# Patient Record
Sex: Female | Born: 1970 | Race: White | Hispanic: No | Marital: Married | Smoking: Former smoker
Health system: Southern US, Community
[De-identification: ages and names within clinical notes are randomized; demographics above are authoritative.]

## PROBLEM LIST (undated history)

## (undated) HISTORY — PX: KNEE SURGERY: SHX244

---

## 2016-09-03 ENCOUNTER — Emergency Department
Admission: EM | Admit: 2016-09-03 | Discharge: 2016-09-03 | Disposition: A | Discharge status: Home / Self Care | Payer: Managed Care, Other (non HMO) | Source: Home / Self Care | Attending: Family Medicine | Admitting: Family Medicine

## 2016-09-03 ENCOUNTER — Emergency Department (INDEPENDENT_AMBULATORY_CARE_PROVIDER_SITE_OTHER): Payer: Managed Care, Other (non HMO)

## 2016-09-03 ENCOUNTER — Encounter: Payer: Self-pay | Admitting: Emergency Medicine

## 2016-09-03 DIAGNOSIS — M533 Sacrococcygeal disorders, not elsewhere classified: Secondary | ICD-10-CM

## 2016-09-03 DIAGNOSIS — M545 Low back pain: Secondary | ICD-10-CM

## 2016-09-03 DIAGNOSIS — Z3200 Encounter for pregnancy test, result unknown: Secondary | ICD-10-CM | POA: Diagnosis not present

## 2016-09-03 DIAGNOSIS — S3992XA Unspecified injury of lower back, initial encounter: Secondary | ICD-10-CM

## 2016-09-03 DIAGNOSIS — W010XXA Fall on same level from slipping, tripping and stumbling without subsequent striking against object, initial encounter: Secondary | ICD-10-CM

## 2016-09-03 DIAGNOSIS — M79672 Pain in left foot: Secondary | ICD-10-CM | POA: Diagnosis not present

## 2016-09-03 DIAGNOSIS — M4317 Spondylolisthesis, lumbosacral region: Secondary | ICD-10-CM

## 2016-09-03 DIAGNOSIS — N760 Acute vaginitis: Secondary | ICD-10-CM

## 2016-09-03 LAB — POCT URINE PREGNANCY: Preg Test, Ur: NEGATIVE

## 2016-09-03 MED ORDER — MELOXICAM 15 MG PO TABS: 15.0000 mg | ORAL_TABLET | Freq: Every day | ORAL | 0 refills | Status: AC

## 2016-09-03 MED ORDER — TRAMADOL HCL 50 MG PO TABS: 50.0000 mg | ORAL_TABLET | Freq: Four times a day (QID) | ORAL | 0 refills | Status: AC | PRN

## 2016-09-03 NOTE — ED Triage Notes (Signed)
Pt c/o tailbone pain after slipping in the yard yesterday. Also c/o left foot pain and vaginal itching x1 week. Tried monistat with no relief.

## 2016-09-03 NOTE — ED Provider Notes (Signed)
biCSN: [id number]     Arrival date & time 09/03/16  1019 History   First MD Initiated Contact with Patient 09/03/16 1053     Chief Complaint  Patient presents with  . Tailbone Pain   (Consider location/radiation/quality/duration/timing/severity/associated sxs/prior Treatment) HPI  Loretta Williams is a 46 y.o. female presenting to UC with a couple of concerns today.  PT c/o sudden onset tailbone and lower back pain after slipping and falling onto wet grass last night after mowing the lawn.  Pain is aching and sore, worse with certain movements and while sitting.  Pain is 9/10.  She did take some ibuprofen last night.  Pt also c/o Left foot pain that has been intermittent for about 3-4 weeks. Hx of plantar fascitis in Right foot. Pain feels similar.  Pain is worse in the evening, worse with dorsiflexing her foot.  Pain is worse on the bottom of her heel. No known injury.   Pt c/o vaginal itching and irritation. Hx of recurrent yeast infections. She has urinary incontinence with sneezing or coughing so she wears a pad and believes that is what causes the yeast infections.  Symptoms have been present for about 1 week. No relief with OTC monostat. Denies concern for STIs or bladder infection. She wants to provide a self-swab in the clinic as she notes this is typically what she does when she is seen for vaginal irritation. Denies recent use of antibiotics.   History reviewed. No pertinent past medical history. Past Surgical History:  Procedure Laterality Date  . KNEE SURGERY     History reviewed. No pertinent family history. Social History  Substance Use Topics  . Smoking status: Former Games developermoker  . Smokeless tobacco: Never Used  . Alcohol use Yes   OB History    No data available     Review of Systems  Gastrointestinal: Negative for abdominal pain, diarrhea, nausea and vomiting.  Genitourinary: Positive for vaginal discharge and vaginal pain (itching and irritation). Negative for dysuria,  flank pain, frequency, hematuria, pelvic pain, urgency and vaginal bleeding.  Musculoskeletal: Positive for arthralgias, back pain, gait problem and myalgias. Negative for joint swelling and neck pain.  Skin: Negative for color change, rash and wound.  Neurological: Negative for weakness and numbness.    Allergies  Patient has no allergy information on record.  Home Medications   Prior to Admission medications   Medication Sig Start Date End Date Taking? Authorizing Provider  meloxicam (MOBIC) 15 MG tablet Take 1 tablet (15 mg total) by mouth daily. 09/03/16   Junius Finner'Malley, Soriya Worster, PA-C  traMADol (ULTRAM) 50 MG tablet Take 1 tablet (50 mg total) by mouth every 6 (six) hours as needed. 09/03/16   Junius Finner'Malley, Kattleya Kuhnert, PA-C   Meds Ordered and Administered this Visit  Medications - No data to display  BP 122/84 (BP Location: Right Arm)   Pulse 78   Temp 98.6 F (37 C) (Oral)   Wt 130 lb (59 kg)   LMP 07/21/2016 (Approximate) Comment: OCP  SpO2 98%  No data found.   Physical Exam  Constitutional: She is oriented to person, place, and time. She appears well-developed and well-nourished. No distress.  HENT:  Head: Normocephalic and atraumatic.  Mouth/Throat: Oropharynx is clear and moist.  Eyes: EOM are normal.  Neck: Normal range of motion.  Cardiovascular: Normal rate.   Pulses:      Dorsalis pedis pulses are 2+ on the left side.       Posterior tibial pulses are 2+ on the  left side.  Pulmonary/Chest: Effort normal.  Abdominal: Soft. She exhibits no distension. There is no tenderness.  Musculoskeletal: Normal range of motion. She exhibits tenderness. She exhibits no edema.  Tenderness to lower lumbar spine and buttock at SI joint and over sacrum.  Mild tenderness to Right side lumbar muscles and buttock. Antalgic gait.  Full ROM upper and lower extremities. Left foot: full ROM, tenderness to bottom of heel.   Neurological: She is alert and oriented to person, place, and time.  Skin: Skin  is warm and dry. Capillary refill takes less than 2 seconds. No rash noted. She is not diaphoretic. No erythema.  Psychiatric: She has a normal mood and affect. Her behavior is normal.  Nursing note and vitals reviewed.   Urgent Care Course     Procedures (including critical care time)  Labs Review Labs Reviewed  WET PREP BY MOLECULAR PROBE  POCT URINE PREGNANCY    Imaging Review Dg Lumbar Spine Complete  Result Date: 09/03/2016 CLINICAL DATA:  Fall yesterday. Central low back and tailbone pain. Initial encounter. EXAM: LUMBAR SPINE - COMPLETE 4+ VIEW COMPARISON:  None. FINDINGS: Five lumbar type vertebral bodies. There is a minimal anterolisthesis at L5-S1. The alignment is otherwise normal. The disc spaces are preserved. No evidence of acute fracture or pars defect. Mild facet degenerative changes are present. There is a small metallic foreign body in the presacral area. IMPRESSION: No acute osseous findings. Mild lower lumbar spondylosis with minimal degenerative anterolisthesis at L5-S1. Electronically Signed   By: Carey Bullocks M.D.   On: 09/03/2016 11:30   Dg Sacrum/coccyx  Result Date: 09/03/2016 CLINICAL DATA:  Fall yesterday. Central low back and tailbone pain. Initial encounter. EXAM: SACRUM AND COCCYX - 2+ VIEW COMPARISON:  None. FINDINGS: There is no evidence of acute fracture. The sacroiliac joints are intact. Small metallic foreign body is noted in the presacral soft tissues. IMPRESSION: No acute osseous findings. Electronically Signed   By: Carey Bullocks M.D.   On: 09/03/2016 11:31   Dg Foot Complete Left  Result Date: 09/03/2016 CLINICAL DATA:  Lateral foot pain for 1 month.  No known injury. EXAM: LEFT FOOT - COMPLETE 3+ VIEW COMPARISON:  None. FINDINGS: The mineralization and alignment are normal. There is no evidence of acute fracture or dislocation. The joint spaces are maintained. There is a small plantar calcaneal spur and type 1 accessory navicular. There is a small  density within the nail of the great toe. IMPRESSION: No acute osseous findings. Electronically Signed   By: Carey Bullocks M.D.   On: 09/03/2016 11:33    MDM   1. Fall from slip, trip, or stumble, initial encounter   2. Tailbone injury, initial encounter   3. Left foot pain   4. Acute vaginitis    Plain films: no acute findings. Reassured pain. Encouraged use of ice and cushioned seating while pain gradually improves.  Rx: Tramadol and meloxicam Will hold off on medication for vaginitis until Wet Prep results.   Home care instructions provided. f/u with PCP and/or Sports Medicine as needed.     Junius Finner, PA-C 09/03/16 1207

## 2016-09-03 NOTE — Discharge Instructions (Signed)
Tramadol is strong pain medication. While taking, do not drink alcohol, drive, or perform any other activities that requires focus while taking these medications.  ° °Meloxicam (Mobic) is an antiinflammatory to help with pain and inflammation.  Do not take ibuprofen, Advil, Aleve, or any other medications that contain NSAIDs while taking meloxicam as this may cause stomach upset or even ulcers if taken in large amounts for an extended period of time.  ° ° °

## 2016-09-05 LAB — WET PREP BY MOLECULAR PROBE
Candida species: NOT DETECTED
Gardnerella vaginalis: DETECTED — AB
Trichomonas vaginosis: NOT DETECTED

## 2016-09-06 ENCOUNTER — Telehealth: Payer: Self-pay | Admitting: *Deleted

## 2016-09-06 MED ORDER — METRONIDAZOLE 500 MG PO TABS: 500.0000 mg | ORAL_TABLET | Freq: Two times a day (BID) | ORAL | 0 refills | Status: AC

## 2016-09-06 NOTE — Telephone Encounter (Signed)
Callback: Patient advised of wet prep results. Per Dr. Cathren HarshBeese, escribed Flagyl 500 mg BID x 7 day to [facility]. Advised to avoid alcohol while taking.

## 2018-12-25 IMAGING — DX DG LUMBAR SPINE COMPLETE 4+V
5 series · 5 of 5 positions shown · non-contrast
Comparison: None.

CLINICAL DATA: Fall yesterday. Central low back and tailbone pain.
Initial encounter.

EXAM:
LUMBAR SPINE - COMPLETE 4+ VIEW

[l-spine ap]
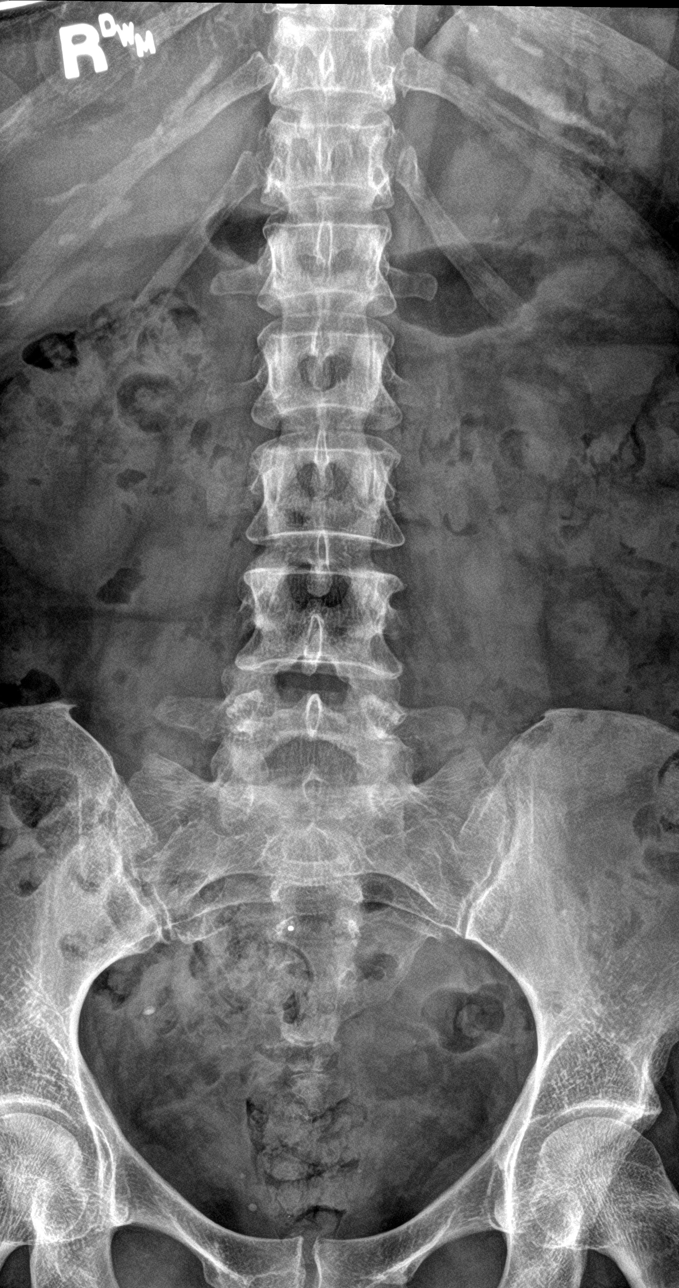

[l-spine obl (1 of 2)]
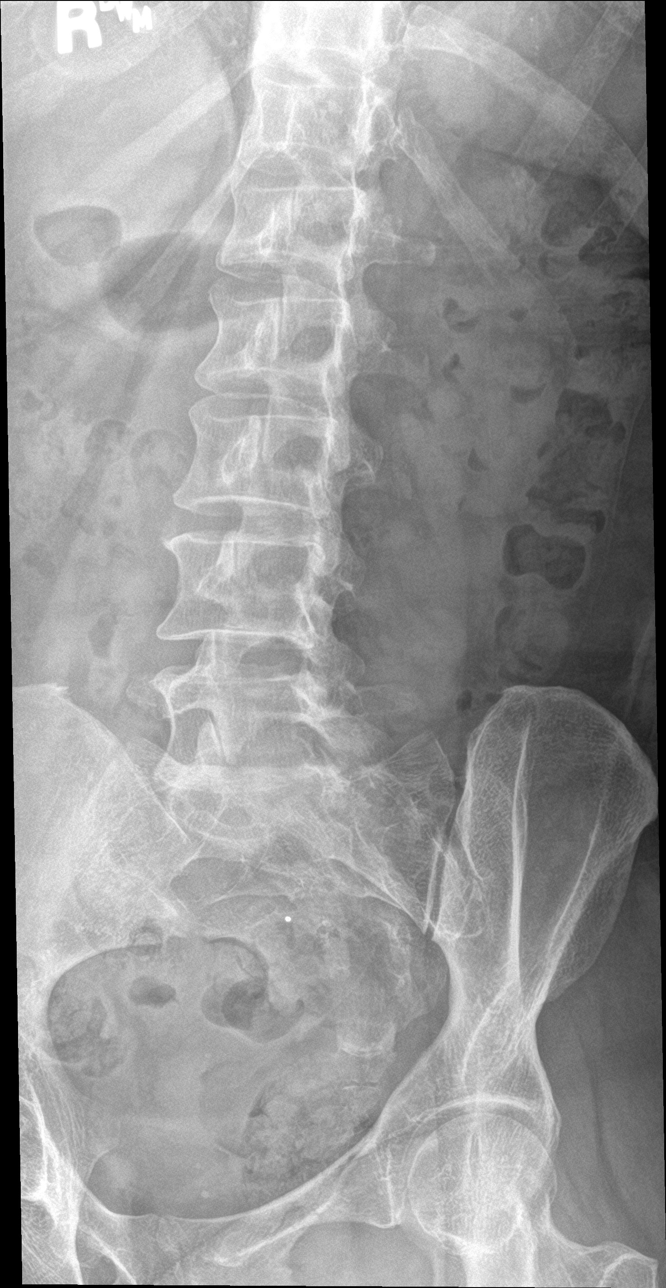

[l-spine lat]
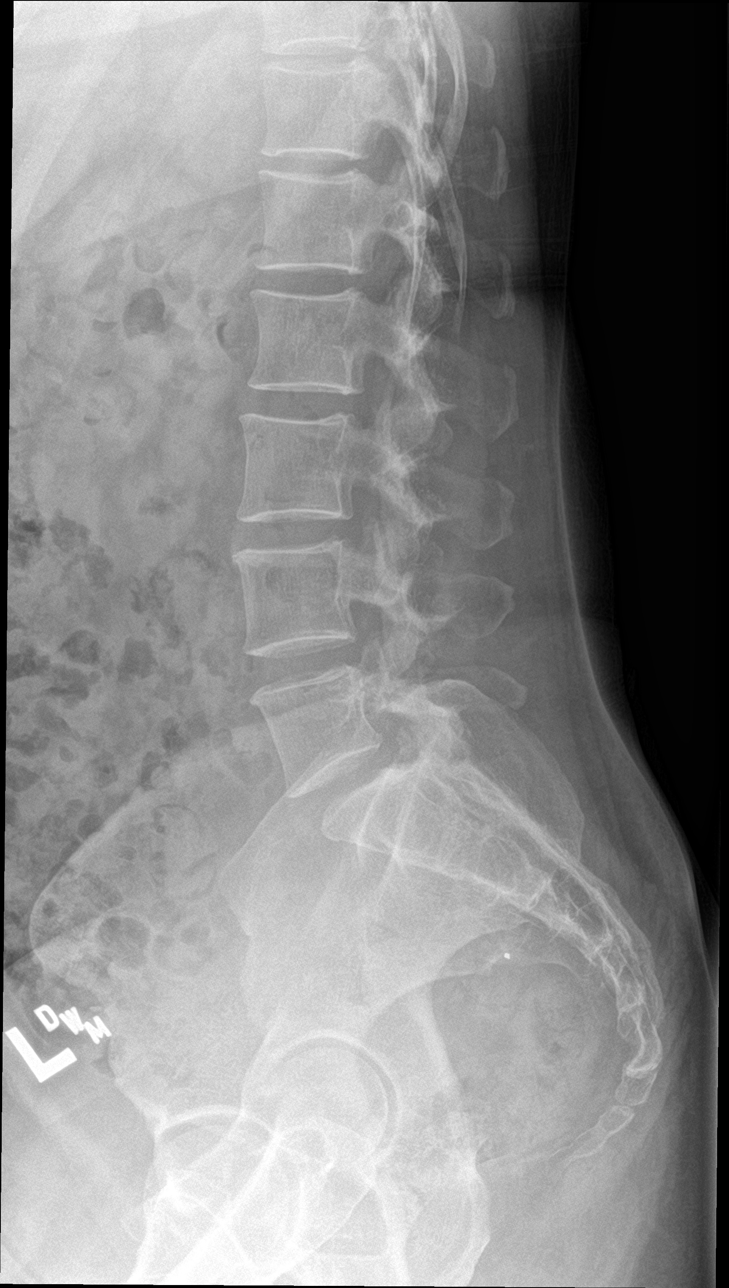

[l-spine spot]
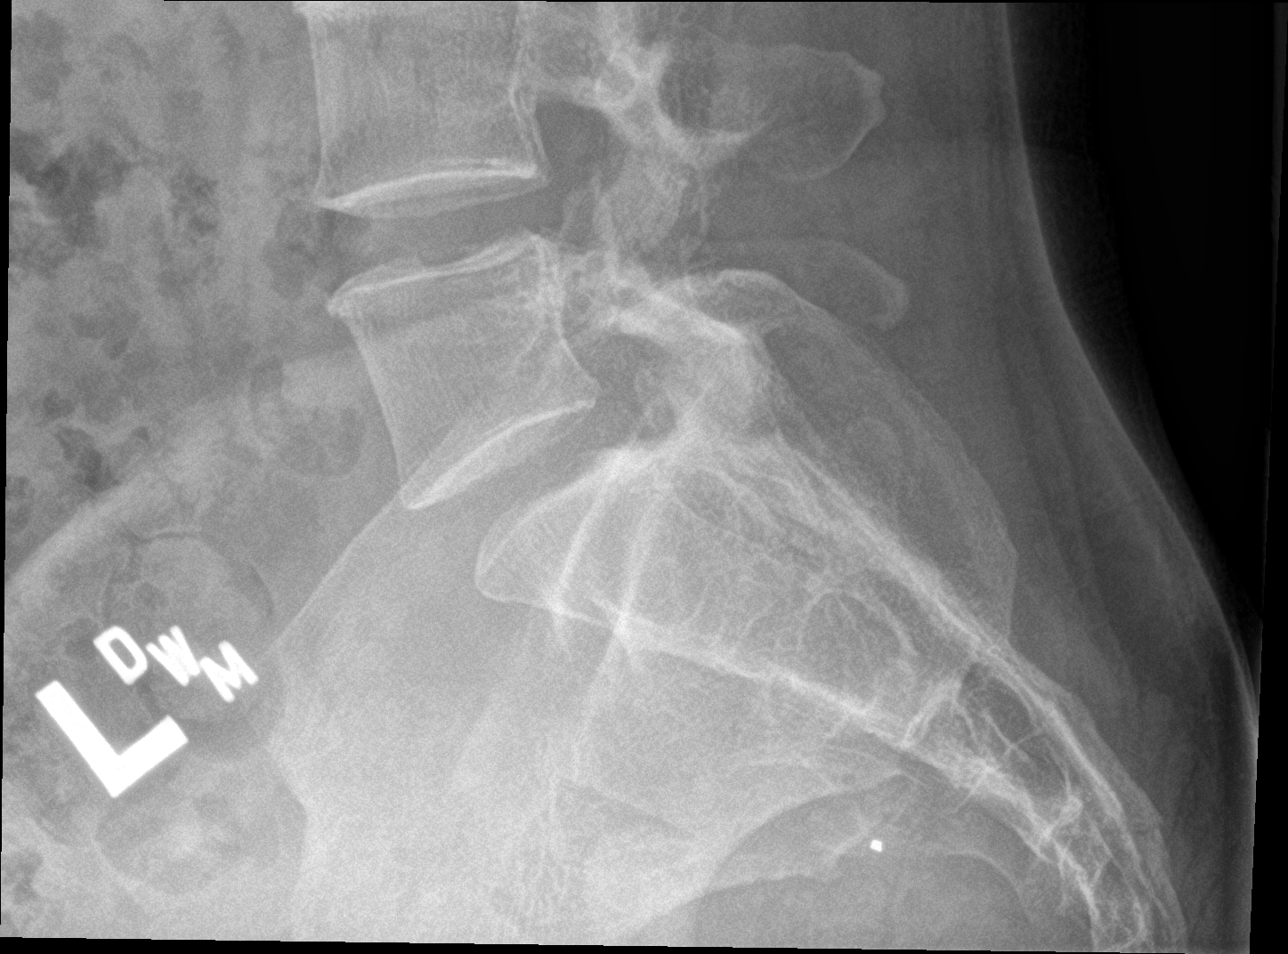

[l-spine obl (2 of 2)]
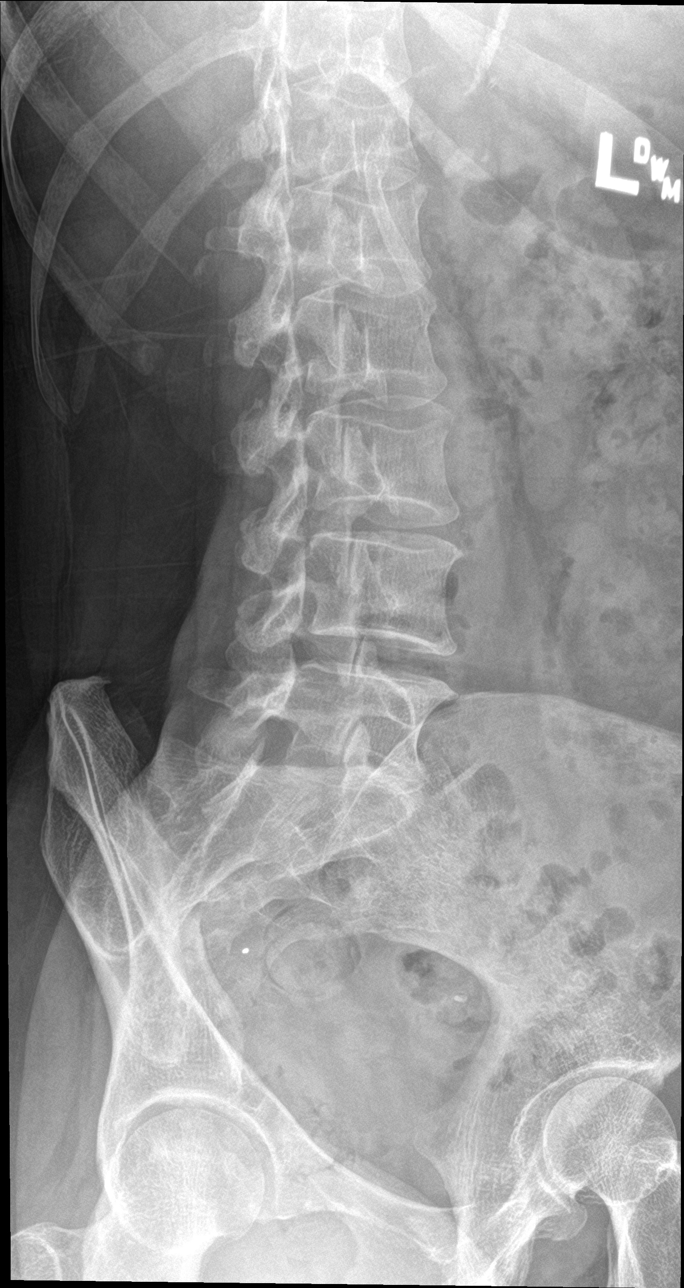

[5 of 5 positions shown; findings below may reference images not displayed]

FINDINGS: Five lumbar type vertebral bodies. There is a minimal
anterolisthesis at L5-S1. The alignment is otherwise normal. The
disc spaces are preserved. No evidence of acute fracture or pars
defect. Mild facet degenerative changes are present. There is a
small metallic foreign body in the presacral area.
IMPRESSION: No acute osseous findings. Mild lower lumbar spondylosis with
minimal degenerative anterolisthesis at L5-S1.
